# Patient Record
Sex: Female | Born: 1995 | Hispanic: Yes | Marital: Married | Smoking: Never smoker
Health system: Southern US, Community
[De-identification: ages and names within clinical notes are randomized; demographics above are authoritative.]

---

## 2015-03-13 HISTORY — PX: DILATION AND EVACUATION: SHX1459

## 2017-02-07 ENCOUNTER — Emergency Department (HOSPITAL_BASED_OUTPATIENT_CLINIC_OR_DEPARTMENT_OTHER): Payer: Medicaid Other

## 2017-02-07 ENCOUNTER — Emergency Department (HOSPITAL_BASED_OUTPATIENT_CLINIC_OR_DEPARTMENT_OTHER)
Admission: EM | Admit: 2017-02-07 | Discharge: 2017-02-07 | Disposition: A | Discharge status: Home / Self Care | Payer: Medicaid Other | Attending: Emergency Medicine | Admitting: Emergency Medicine

## 2017-02-07 ENCOUNTER — Encounter (HOSPITAL_BASED_OUTPATIENT_CLINIC_OR_DEPARTMENT_OTHER): Payer: Self-pay | Admitting: *Deleted

## 2017-02-07 ENCOUNTER — Other Ambulatory Visit: Payer: Self-pay

## 2017-02-07 DIAGNOSIS — O200 Threatened abortion: Secondary | ICD-10-CM

## 2017-02-07 DIAGNOSIS — Z3A01 Less than 8 weeks gestation of pregnancy: Secondary | ICD-10-CM | POA: Insufficient documentation

## 2017-02-07 LAB — WET PREP, GENITAL
Clue Cells Wet Prep HPF POC: NONE SEEN
SPERM: NONE SEEN
Trich, Wet Prep: NONE SEEN
YEAST WET PREP: NONE SEEN

## 2017-02-07 LAB — HCG, QUANTITATIVE, PREGNANCY: HCG, BETA CHAIN, QUANT, S: 25610 m[IU]/mL — AB (ref <5)

## 2017-02-07 NOTE — Discharge Instructions (Addendum)
Follow up with OBGYN. You need to have a repeat US and blood test in 14 days to reassess.

## 2017-02-07 NOTE — ED Provider Notes (Signed)
[facility] EMERGENCY DEPARTMENT Provider Note   CSN: [id number] Arrival date & time: 02/07/17  1303     History   Chief Complaint Chief Complaint  Patient presents with  . Vaginal Bleeding    HPI Abigail Campbell is a 21 y.o. female.  21 yo F G2P0010 with a chief complaint of vaginal bleeding.  The patient took a pregnancy test that was positive.  Is estimated that she is about [redacted] weeks pregnant.  Has been seen by OB but no ultrasound performed.  She denies any abdominal pain.  Is bleeding about normal for her menstrual cycle.  Had some mild pelvic discomfort that has resolved.  Denies dysuria.  Is currently being treated for bacterial vaginosis.   The history is provided by the patient.  Vaginal Bleeding  Primary symptoms include pelvic pain, vaginal bleeding.  Primary symptoms include no discharge, no dysuria. There has been no fever. The fever has been present for less than 1 day. This is a new problem. The current episode started 2 days ago. The problem occurs constantly. The problem has been gradually worsening. Her LMP was months ago. The patient's menstrual history has been regular. The discharge was normal. Pertinent negatives include no abdominal pain, no nausea, no vomiting and no dizziness. She has tried nothing for the symptoms. The treatment provided no relief. Sexual activity: sexually active. There is no concern regarding sexually transmitted diseases. She uses nothing for contraception.    History reviewed. No pertinent past medical history.  There are no active problems to display for this patient.   Past Surgical History:  Procedure Laterality Date  . DILATION AND EVACUATION  2017   8 weeks missed AB, surgery in [place]    OB History    Gravida Para Term Preterm AB Living   2 0 0 0 1 0   SAB TAB Ectopic Multiple Live Births   1 0 0 0 0       Home Medications    Prior to Admission medications   Medication Sig Start Date End Date Taking?  Authorizing Provider  nitrofurantoin (MACRODANTIN) 100 MG capsule Take 100 mg by mouth 4 (four) times daily.   Yes [provider]    Family History No family history on file.  Social History Social History   Tobacco Use  . Smoking status: Never Smoker  . Smokeless tobacco: Never Used  Substance Use Topics  . Alcohol use: No    Frequency: Never  . Drug use: No     Allergies   Patient has no known allergies.   Review of Systems Review of Systems  Constitutional: Negative for chills and fever.  HENT: Negative for congestion and rhinorrhea.   Eyes: Negative for redness and visual disturbance.  Respiratory: Negative for shortness of breath and wheezing.   Cardiovascular: Negative for chest pain and palpitations.  Gastrointestinal: Negative for abdominal pain, nausea and vomiting.  Genitourinary: Positive for pelvic pain and vaginal bleeding. Negative for dysuria and urgency.  Musculoskeletal: Negative for arthralgias and myalgias.  Skin: Negative for pallor and wound.  Neurological: Negative for dizziness and headaches.     Physical Exam Updated Vital Signs BP 104/65 (BP Location: Right Arm)   Pulse 81   Temp 98.9 F (37.2 C) (Oral)   Resp 16   Ht 5\' 7"  (1.702 m)   Wt 68 kg (150 lb)   LMP 12/29/2016   SpO2 100%   BMI 23.49 kg/m   Physical Exam  Constitutional: She is  oriented to person, place, and time. She appears well-developed and well-nourished. No distress.  HENT:  Head: Normocephalic and atraumatic.  Eyes: EOM are normal. Pupils are equal, round, and reactive to light.  Neck: Normal range of motion. Neck supple.  Cardiovascular: Normal rate and regular rhythm. Exam reveals no gallop and no friction rub.  No murmur heard. Pulmonary/Chest: Effort normal. She has no wheezes. She has no rales.  Abdominal: Soft. She exhibits no distension and no mass. There is no tenderness. There is no guarding.  Genitourinary: Uterus is enlarged. Cervix exhibits  discharge (mucousy). Cervix exhibits no motion tenderness. Right adnexum displays no mass, no tenderness and no fullness. Left adnexum displays no mass, no tenderness and no fullness.  Genitourinary Comments: Small amount of mucousy discharge at the cervix.  Blood in the vault was removed with about 8 fox swabs.  No cervical motion tenderness.  No adnexal tenderness or fullness.  Musculoskeletal: She exhibits no edema or tenderness.  Neurological: She is alert and oriented to person, place, and time.  Skin: Skin is warm and dry. She is not diaphoretic.  Psychiatric: She has a normal mood and affect. Her behavior is normal.  Nursing note and vitals reviewed.    ED Treatments / Results  Labs (all labs ordered are listed, but only abnormal results are displayed) Labs Reviewed  WET PREP, GENITAL - Abnormal; Notable for the following components:      Result Value   WBC, Wet Prep HPF POC FEW (*)    All other components within normal limits  PREGNANCY, URINE  HCG, QUANTITATIVE, PREGNANCY  GC/CHLAMYDIA PROBE AMP ([place]) NOT AT [facility]    EKG  EKG Interpretation None       Radiology [place] Ob Comp < 14 Wks  Result Date: 02/07/2017 CLINICAL DATA:  21 y/o  F; 4 days of bleeding. EXAM: OBSTETRIC <14 WK US AND TRANSVAGINAL OB US TECHNIQUE: Both transabdominal and transvaginal ultrasound examinations were performed for complete evaluation of the gestation as well as the maternal uterus, adnexal regions, and pelvic cul-de-sac. Transvaginal technique was performed to assess early pregnancy. COMPARISON:  None. FINDINGS: Intrauterine gestational sac: Single Yolk sac:  Visualized. Embryo:  Not Visualized. Cardiac Activity: Not Visualized. CRL:  12  mm   6 w   0 d Subchorionic hemorrhage:  Large volume subarachnoid hemorrhage. Maternal uterus/adnexae: Right ovary corpus luteum. No adnexal mass identified. Small volume of pelvic fluid. IMPRESSION: 1. Early intrauterine gestational sac and yolk sac, but  no fetal pole or cardiac activity yet visualized. Recommend follow-up quantitative B-HCG levels and follow-up US in 14 days to assess viability. This recommendation follows SRU consensus guidelines: Diagnostic Criteria for Nonviable Pregnancy Early in the First Trimester. Malva Limes Engl J Med 2013; 696:2952-84; 369:1443-51. 2. Large volume subarachnoid hemorrhage. 3. Small volume of pelvic fluid.  No adnexal lesion. Electronically Signed   By: Mitzi HansenLance  Furusawa-Stratton M.D.   On: 02/07/2017 15:13   [place] Ob Transvaginal  Result Date: 02/07/2017 CLINICAL DATA:  21 y/o  F; 4 days of bleeding. EXAM: OBSTETRIC <14 WK US AND TRANSVAGINAL OB US TECHNIQUE: Both transabdominal and transvaginal ultrasound examinations were performed for complete evaluation of the gestation as well as the maternal uterus, adnexal regions, and pelvic cul-de-sac. Transvaginal technique was performed to assess early pregnancy. COMPARISON:  None. FINDINGS: Intrauterine gestational sac: Single Yolk sac:  Visualized. Embryo:  Not Visualized. Cardiac Activity: Not Visualized. CRL:  12  mm   6 w   0 d Subchorionic hemorrhage:  Large  volume subarachnoid hemorrhage. Maternal uterus/adnexae: Right ovary corpus luteum. No adnexal mass identified. Small volume of pelvic fluid. IMPRESSION: 1. Early intrauterine gestational sac and yolk sac, but no fetal pole or cardiac activity yet visualized. Recommend follow-up quantitative B-HCG levels and follow-up US in 14 days to assess viability. This recommendation follows SRU consensus guidelines: Diagnostic Criteria for Nonviable Pregnancy Early in the First Trimester. Malva Limes Engl J Med 2013; 454:0981-19; 369:1443-51. 2. Large volume subarachnoid hemorrhage. 3. Small volume of pelvic fluid.  No adnexal lesion. Electronically Signed   By: Mitzi HansenLance  Furusawa-Stratton M.D.   On: 02/07/2017 15:13    Procedures Procedures (including critical care time) EMERGENCY DEPARTMENT US PREGNANCY "Study: Limited Ultrasound of the  Pelvis"  INDICATIONS:Pregnancy(required) and Vaginal bleeding Multiple views of the uterus and pelvic cavity are obtained with a multi-frequency probe.  APPROACH:Transabdominal  and Transvaginal  PERFORMED BY: Myself  IMAGES ARCHIVED?: Yes  LIMITATIONS: Body habitus and Decompressed bladder  PREGNANCY FREE FLUID: Present  PREGNANCY UTERUS FINDINGS:Uterus enlarged and Gestational sac noted ADNEXAL FINDINGS:Left ovarion cyst  PREGNANCY FINDINGS: Intrauterine gestational sac noted, Yolk sac noted and Fetal pole present  INTERPRETATION: Intrauterine gestational sac noted and Yolk sac noted  GESTATIONAL AGE, ESTIMATE: 6wk 2 day.  Ultrasound with questionable sac in the left adnexa.  Will obtain a formal  Medications Ordered in ED Medications - No data to display   Initial Impression / Assessment and Plan / ED Course  I have reviewed the triage vital signs and the nursing notes.  Pertinent labs & imaging results that were available during my care of the patient were reviewed by me and considered in my medical decision making (see chart for details).     21 yo F gravida 2 para 0010 with a chief complaint of vaginal bleeding while pregnant.  Patient estimates she is about [redacted] weeks pregnant.  Bedside ultrasound with enlarged uterus and gestational sac but no definitive contents inside.  Seen on intravaginal ultrasound.  Patient's adnexa was not as I expected.  Will obtain a formal further evaluate.  HCG sent.  No heterotopic pregnancy was noted on normal ultrasound.  There was a large amount of subchorionic hemorrhage.  We will have the patient follow-up with her OB/GYN in 14 days for repeat.  3:18 PM:  I have discussed the diagnosis/risks/treatment options with the patient and family and believe the pt to be eligible for discharge home to follow-up with OB. We also discussed returning to the ED immediately if new or worsening sx occur. We discussed the sx which are most concerning  (e.g., sudden worsening pain, fever, inability to tolerate by mouth) that necessitate immediate return. Medications administered to the patient during their visit and any new prescriptions provided to the patient are listed below.  Medications given during this visit Medications - No data to display   The patient appears reasonably screen and/or stabilized for discharge and I doubt any other medical condition or other [facility] requiring further screening, evaluation, or treatment in the ED at this time prior to discharge.    Final Clinical Impressions(s) / ED Diagnoses   Final diagnoses:  Threatened miscarriage    ED Discharge Orders    None       Melene PlanFloyd, Vianey Caniglia, DO 02/07/17 1518

## 2017-02-07 NOTE — ED Triage Notes (Signed)
[redacted] weeks pregnant. She has vaginal bleeding for 4 days. Worse today. She was started on an antibiotic for UTI 4 days ago.

## 2017-02-08 LAB — GC/CHLAMYDIA PROBE AMP (~~LOC~~) NOT AT ARMC
CHLAMYDIA, DNA PROBE: NEGATIVE
Neisseria Gonorrhea: NEGATIVE

## 2018-03-11 ENCOUNTER — Other Ambulatory Visit: Payer: Self-pay

## 2018-03-11 ENCOUNTER — Emergency Department (HOSPITAL_BASED_OUTPATIENT_CLINIC_OR_DEPARTMENT_OTHER)
Admission: EM | Admit: 2018-03-11 | Discharge: 2018-03-11 | Disposition: A | Discharge status: Home / Self Care | Payer: Medicaid Other | Attending: Emergency Medicine | Admitting: Emergency Medicine

## 2018-03-11 ENCOUNTER — Encounter (HOSPITAL_BASED_OUTPATIENT_CLINIC_OR_DEPARTMENT_OTHER): Payer: Self-pay | Admitting: *Deleted

## 2018-03-11 DIAGNOSIS — J069 Acute upper respiratory infection, unspecified: Secondary | ICD-10-CM | POA: Insufficient documentation

## 2018-03-11 DIAGNOSIS — R05 Cough: Secondary | ICD-10-CM | POA: Diagnosis present

## 2018-03-11 DIAGNOSIS — N3000 Acute cystitis without hematuria: Secondary | ICD-10-CM | POA: Diagnosis not present

## 2018-03-11 DIAGNOSIS — Z79899 Other long term (current) drug therapy: Secondary | ICD-10-CM | POA: Insufficient documentation

## 2018-03-11 DIAGNOSIS — B9789 Other viral agents as the cause of diseases classified elsewhere: Secondary | ICD-10-CM

## 2018-03-11 LAB — URINALYSIS, ROUTINE W REFLEX MICROSCOPIC
BILIRUBIN URINE: NEGATIVE
Glucose, UA: NEGATIVE mg/dL
Hgb urine dipstick: NEGATIVE
KETONES UR: 15 mg/dL — AB
Nitrite: NEGATIVE
PROTEIN: NEGATIVE mg/dL
Specific Gravity, Urine: 1.02 (ref 1.005–1.030)
pH: 6.5 (ref 5.0–8.0)

## 2018-03-11 LAB — URINALYSIS, MICROSCOPIC (REFLEX)

## 2018-03-11 LAB — CBG MONITORING, ED: GLUCOSE-CAPILLARY: 69 mg/dL — AB (ref 70–99)

## 2018-03-11 MED ORDER — CEPHALEXIN 500 MG PO CAPS: 500.0000 mg | ORAL_CAPSULE | Freq: Two times a day (BID) | ORAL | 0 refills | Status: AC

## 2018-03-11 MED ORDER — CEPHALEXIN 250 MG PO CAPS
500.0000 mg | ORAL_CAPSULE | Freq: Once | ORAL | Status: AC
  Administered 2018-03-11: 500 mg via ORAL
  Filled 2018-03-11: qty 2

## 2018-03-11 NOTE — ED Triage Notes (Addendum)
Cough, fever and chills x 4 days. She is [redacted] weeks pregnant with no pregnancy related complaints today.

## 2018-03-11 NOTE — Discharge Instructions (Addendum)
Take the medications as prescribed.  Follow-up with your OB/GYN doctor to make sure the infection resolves.  You can also take over-the-counter Tylenol as needed

## 2018-03-11 NOTE — ED Provider Notes (Signed)
[facility] EMERGENCY DEPARTMENT Provider Note   CSN: [id number] Arrival date & time: 03/11/18  1133    History   Chief Complaint Chief Complaint  Patient presents with  . Cough    HPI Abigail Campbell is a 22 y.o. female.  HPI Patient presents to the emergency room with complaints of cough chills and feeling fevers for the last 4 days.  Patient has had runny nose and nasal congestion.  She has had slight sore throat.  She had no abdominal pain.  No vomiting or diarrhea.  No dysuria.  Patient is [redacted] weeks pregnant but has not had any complications with the pregnancy.  She denies any vaginal bleeding. History reviewed. No pertinent past medical history.  There are no active problems to display for this patient.   Past Surgical History:  Procedure Laterality Date  . DILATION AND EVACUATION  2017   8 weeks missed AB, surgery in [place]     OB History    Gravida  2   Para  0   Term  0   Preterm  0   AB  1   Living  0     SAB  1   TAB  0   Ectopic  0   Multiple  0   Live Births  0            Home Medications    Prior to Admission medications   Medication Sig Start Date End Date Taking? Authorizing Provider  cephALEXin (KEFLEX) 500 MG capsule Take 1 capsule (500 mg total) by mouth 2 (two) times daily. 03/11/18   Linwood DibblesKnapp, Margarite Vessel, MD  nitrofurantoin (MACRODANTIN) 100 MG capsule Take 100 mg by mouth 4 (four) times daily.    [provider]    Family History No family history on file.  Social History Social History   Tobacco Use  . Smoking status: Never Smoker  . Smokeless tobacco: Never Used  Substance Use Topics  . Alcohol use: No    Frequency: Never  . Drug use: No     Allergies   Patient has no known allergies.   Review of Systems Review of Systems  All other systems reviewed and are negative.    Physical Exam Updated Vital Signs BP 106/82 (BP Location: Right Arm)   Pulse 100   Temp 98.8 F (37.1 C) (Oral)    Resp 16   Ht 1.727 m (5\' 8" )   Wt 72.1 kg   LMP 08/19/2017   SpO2 100%   BMI 24.18 kg/m   Physical Exam Vitals signs and nursing note reviewed.  Constitutional:      General: She is not in acute distress.    Appearance: She is well-developed.  HENT:     Head: Normocephalic and atraumatic.     Right Ear: External ear normal.     Left Ear: External ear normal.     Nose: Congestion present.     Mouth/Throat:     Pharynx: No oropharyngeal exudate or posterior oropharyngeal erythema.  Eyes:     General: No scleral icterus.       Right eye: No discharge.        Left eye: No discharge.     Conjunctiva/sclera: Conjunctivae normal.  Neck:     Musculoskeletal: Neck supple.     Trachea: No tracheal deviation.  Cardiovascular:     Rate and Rhythm: Normal rate and regular rhythm.  Pulmonary:     Effort: Pulmonary effort is normal. No  respiratory distress.     Breath sounds: Normal breath sounds. No stridor. No wheezing or rales.  Abdominal:     General: Bowel sounds are normal. There is no distension.     Palpations: Abdomen is soft.     Tenderness: There is no abdominal tenderness. There is no guarding or rebound.     Comments: Gravid uterus  Musculoskeletal:        General: No tenderness.  Skin:    General: Skin is warm and dry.     Findings: No rash.  Neurological:     Mental Status: She is alert.     Cranial Nerves: No cranial nerve deficit (no facial droop, extraocular movements intact, no slurred speech).     Sensory: No sensory deficit.     Motor: No abnormal muscle tone or seizure activity.     Coordination: Coordination normal.      ED Treatments / Results  Labs (all labs ordered are listed, but only abnormal results are displayed) Labs Reviewed  URINALYSIS, ROUTINE W REFLEX MICROSCOPIC - Abnormal; Notable for the following components:      Result Value   APPearance CLOUDY (*)    Ketones, ur 15 (*)    Leukocytes, UA LARGE (*)    All other components within  normal limits  URINALYSIS, MICROSCOPIC (REFLEX) - Abnormal; Notable for the following components:   Bacteria, UA MANY (*)    All other components within normal limits  CBG MONITORING, ED - Abnormal; Notable for the following components:   Glucose-Capillary 69 (*)    All other components within normal limits  URINE CULTURE    EKG None  Radiology No results found.  Procedures Procedures (including critical care time)  Medications Ordered in ED Medications  cephALEXin (KEFLEX) capsule 500 mg (500 mg Oral Given 03/11/18 1453)     Initial Impression / Assessment and Plan / ED Course  I have reviewed the triage vital signs and the nursing notes.  Pertinent labs & imaging results that were available during my care of the patient were reviewed by me and considered in my medical decision making (see chart for details).  Clinical Course as of Mar 11 1516  Tue Mar 11, 2018  1513 Heart to fetal heart tones reassuring at 140   [JK]    Clinical Course User Index [JK] Linwood DibblesKnapp, Sherilyn Windhorst, MD    Patient presented to the emergency room for evaluation of URI cough and congestion.  Patient initially had a mild tachycardia but repeat pulse rate was normal.  Patient is afebrile nontoxic.  Chest is clear to auscultation.  I doubt pneumonia.  Patient was not having any symptoms concerning for complications associated with her pregnancy.  She had reassuring fetal heart tones.  Urinalysis did suggest a urinary tract infection.  I will discharge her home with a prescription of Keflex.  Follow-up with her OB/GYN doctor.  Final Clinical Impressions(s) / ED Diagnoses   Final diagnoses:  Viral URI with cough  Acute cystitis without hematuria    ED Discharge Orders         Ordered    cephALEXin (KEFLEX) 500 MG capsule  2 times daily     03/11/18 1515           Linwood DibblesKnapp, Amaliya Whitelaw, MD 03/11/18 [id number]

## 2018-03-12 LAB — URINE CULTURE

## 2018-08-05 IMAGING — US US OB TRANSVAGINAL
1 series · 13 of 28 positions shown · non-contrast
Comparison: None.

CLINICAL DATA: 20 y/o  F; 4 days of bleeding.

EXAM:
OBSTETRIC <14 WK US AND TRANSVAGINAL OB US
TECHNIQUE: Both transabdominal and transvaginal ultrasound examinations were
performed for complete evaluation of the gestation as well as the
maternal uterus, adnexal regions, and pelvic cul-de-sac.
Transvaginal technique was performed to assess early pregnancy.

[Series 1: us ob transvaginal · 0.21mm/px · 13 of 100 slices shown]
[im 4/100]
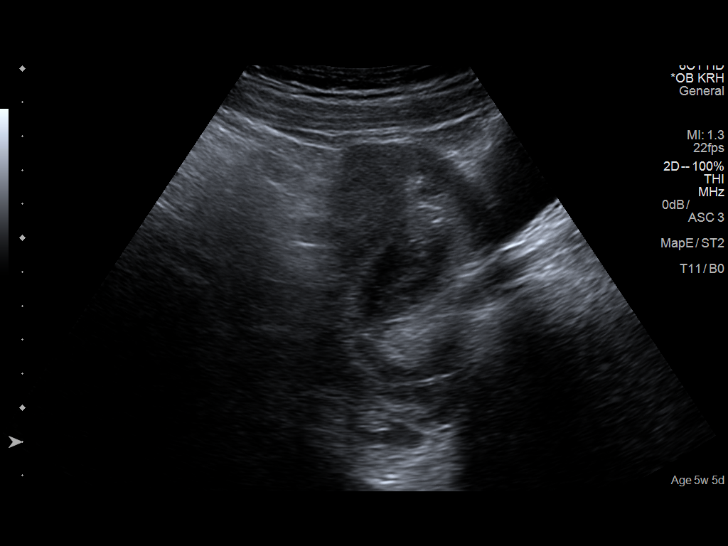
[im 12/100]
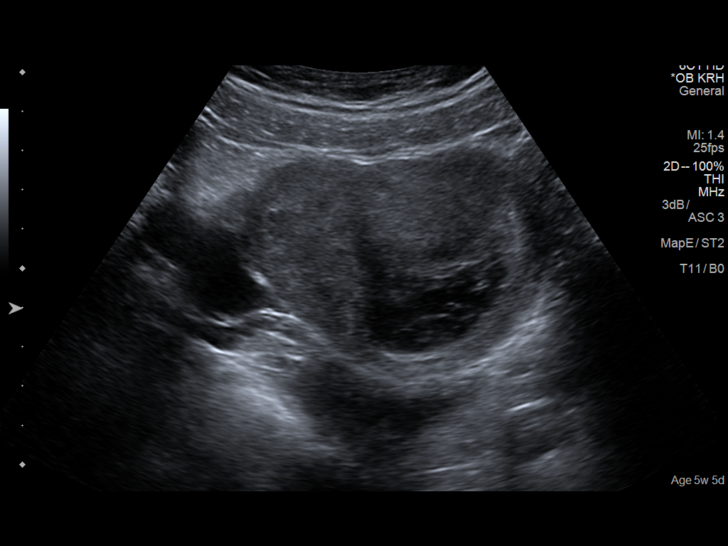
[im 19/100]
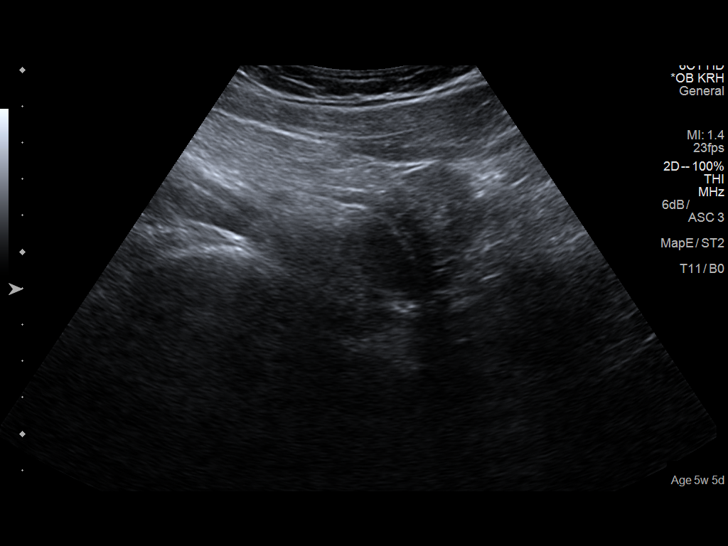
[im 26/100]
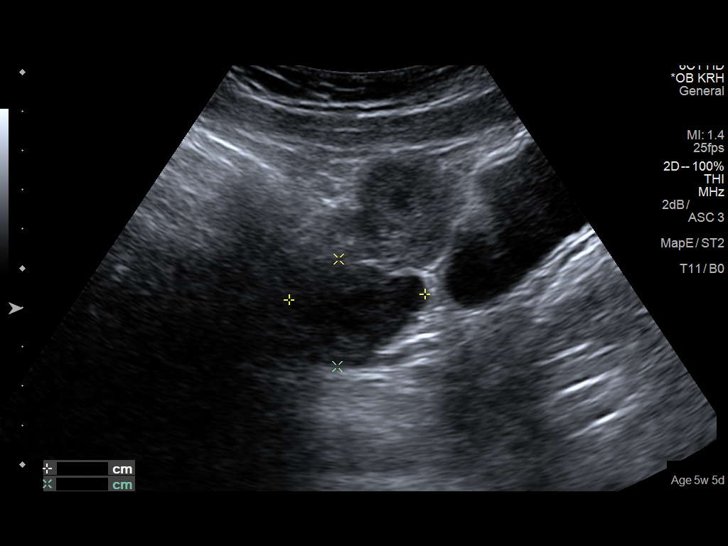
[im 34/100]
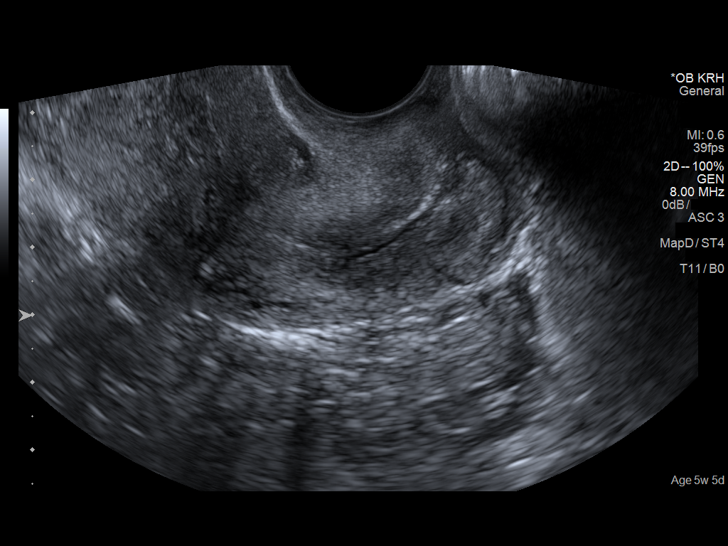
[im 41/100]
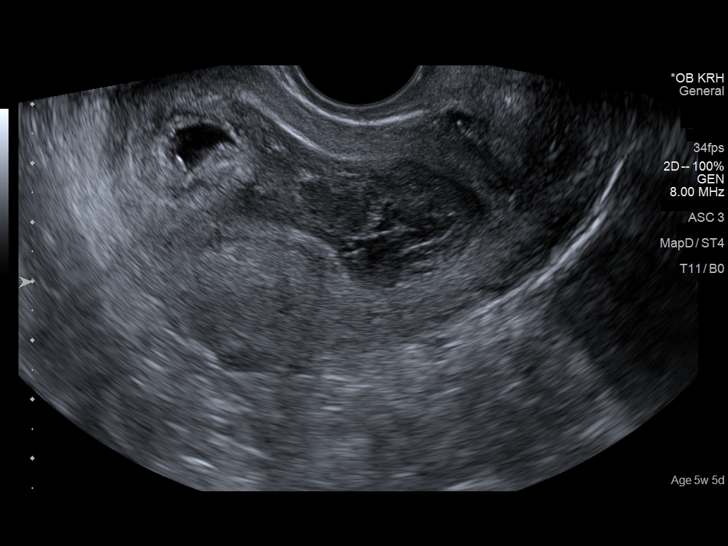
[im 52/100]
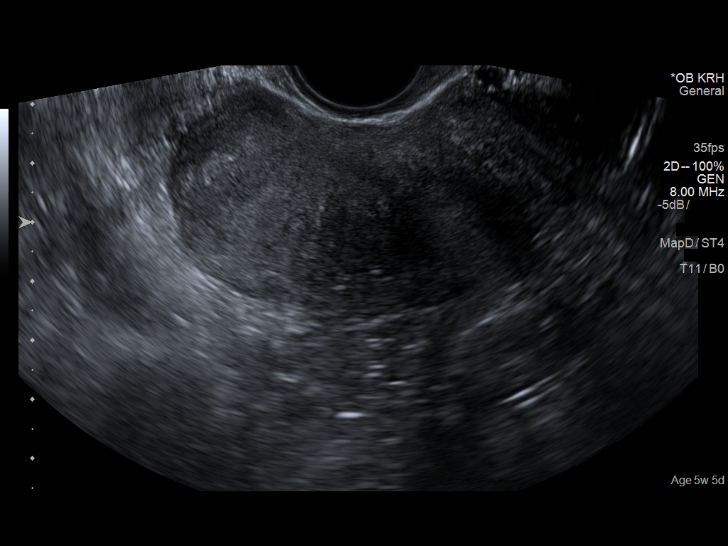
[im 59/100]
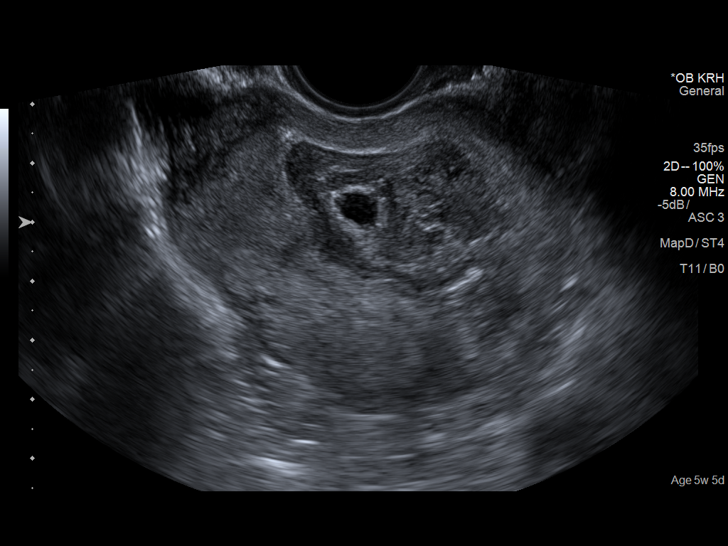
[im 67/100]
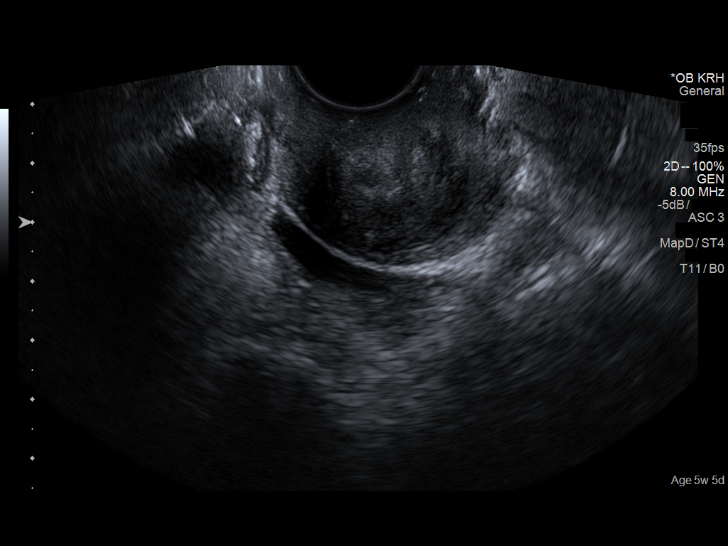
[im 74/100]
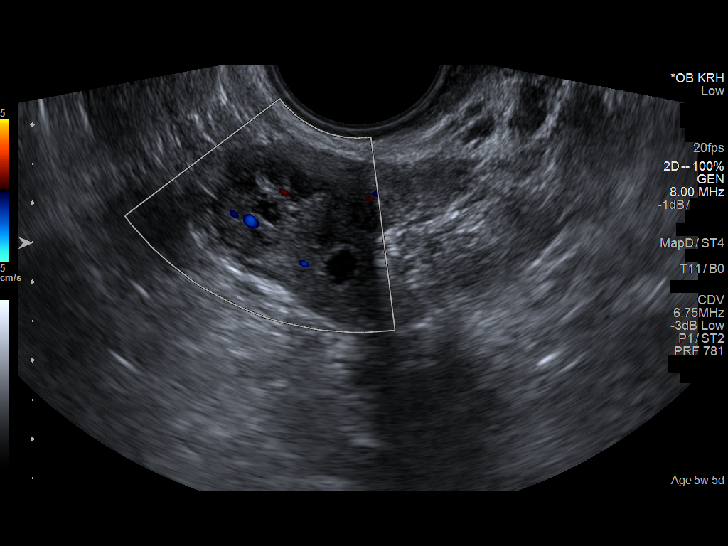
[im 81/100]
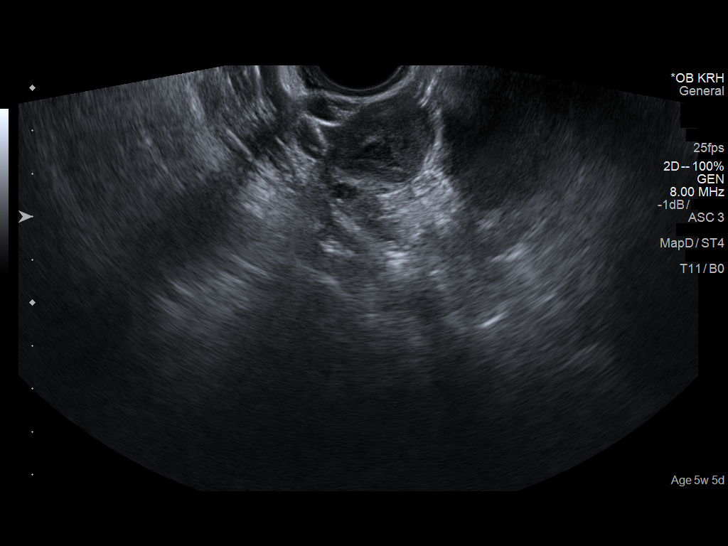
[im 89/100]
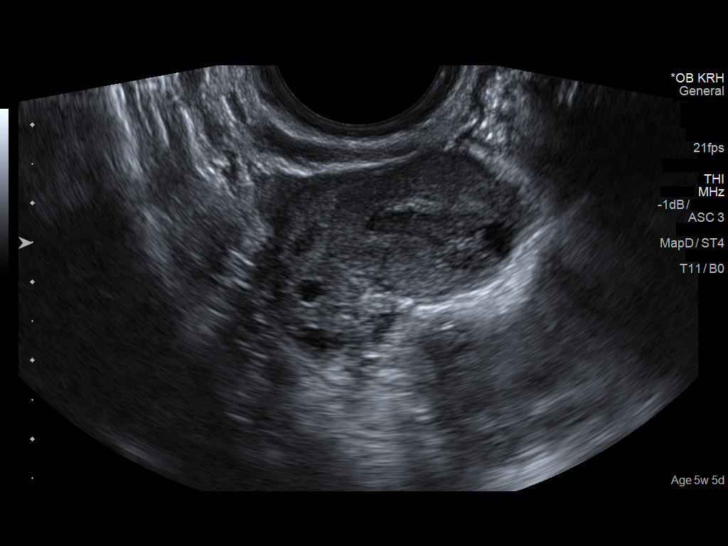
[im 96/100]
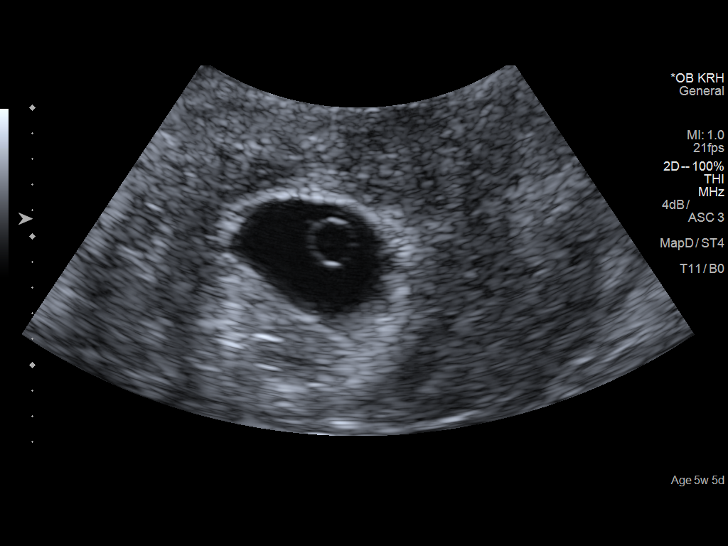

[13 of 28 positions shown; findings below may reference images not displayed]

FINDINGS: Intrauterine gestational sac: Single

Yolk sac:  Visualized.

Embryo:  Not Visualized.

Cardiac Activity: Not Visualized.

CRL:  12  mm   6 w   0 d

Subchorionic hemorrhage:  Large volume subarachnoid hemorrhage.

Maternal uterus/adnexae: Right ovary corpus luteum. No adnexal mass
identified. Small volume of pelvic fluid.
IMPRESSION: 1. Early intrauterine gestational sac and yolk sac, but no fetal
pole or cardiac activity yet visualized. Recommend follow-up
quantitative B-HCG levels and follow-up US in 14 days to assess
viability. This recommendation follows SRU consensus guidelines:
Diagnostic Criteria for Nonviable Pregnancy Early in the First
Trimester. N Engl J Med 4107; [DATE].
2. Large volume subarachnoid hemorrhage.
3. Small volume of pelvic fluid.  No adnexal lesion.

By: Jarocha Gaxiola M.D.
